# Patient Record
Sex: Male | Born: 1990 | Race: Black or African American | Hispanic: No | Marital: Single | Smoking: Never smoker
Health system: Southern US, Community
[De-identification: ages and names within clinical notes are randomized; demographics above are authoritative.]

---
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
## 2021-01-28 ENCOUNTER — Emergency Department (HOSPITAL_BASED_OUTPATIENT_CLINIC_OR_DEPARTMENT_OTHER): Payer: Self-pay

## 2021-01-28 ENCOUNTER — Other Ambulatory Visit: Payer: Self-pay

## 2021-01-28 ENCOUNTER — Encounter (HOSPITAL_BASED_OUTPATIENT_CLINIC_OR_DEPARTMENT_OTHER): Payer: Self-pay | Admitting: Emergency Medicine

## 2021-01-28 ENCOUNTER — Emergency Department (HOSPITAL_BASED_OUTPATIENT_CLINIC_OR_DEPARTMENT_OTHER)
Admission: EM | Admit: 2021-01-28 | Discharge: 2021-01-28 | Disposition: A | Discharge status: Home / Self Care | Payer: Self-pay | Attending: Emergency Medicine | Admitting: Emergency Medicine

## 2021-01-28 DIAGNOSIS — Y9389 Activity, other specified: Secondary | ICD-10-CM | POA: Insufficient documentation

## 2021-01-28 DIAGNOSIS — M545 Low back pain, unspecified: Secondary | ICD-10-CM | POA: Insufficient documentation

## 2021-01-28 DIAGNOSIS — M546 Pain in thoracic spine: Secondary | ICD-10-CM | POA: Insufficient documentation

## 2021-01-28 DIAGNOSIS — Y92410 Unspecified street and highway as the place of occurrence of the external cause: Secondary | ICD-10-CM | POA: Insufficient documentation

## 2021-01-28 MED ORDER — KETOROLAC TROMETHAMINE 30 MG/ML IJ SOLN
30.0000 mg | Freq: Once | INTRAMUSCULAR | Status: AC
  Administered 2021-01-28: 30 mg via INTRAMUSCULAR
  Filled 2021-01-28: qty 1

## 2021-01-28 MED ORDER — LIDOCAINE 5 % EX PTCH: 1.0000 | MEDICATED_PATCH | CUTANEOUS | 0 refills | Status: AC

## 2021-01-28 MED ORDER — NAPROXEN 500 MG PO TABS: 500.0000 mg | ORAL_TABLET | Freq: Two times a day (BID) | ORAL | 0 refills | Status: AC

## 2021-01-28 MED ORDER — LIDOCAINE 5 % EX PTCH
1.0000 | MEDICATED_PATCH | CUTANEOUS | Status: DC
  Administered 2021-01-28: 1 via TRANSDERMAL
  Filled 2021-01-28: qty 1

## 2021-01-28 NOTE — ED Notes (Signed)
Pt alert, NAD, calm, interactive, ambulatory to xray, steady gait.
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
## 2021-01-28 NOTE — Discharge Instructions (Addendum)
Applied Lidoderm patches every 24 hours as needed. Take naproxen twice daily with food and water for the next 7 days.  This medication can cause ulcers which important to take with food. Return to ED if things change or worsen.
